# Patient Record
Sex: Male | Born: 1970 | Race: Black or African American | Hispanic: No | Marital: Single | Smoking: Never smoker
Health system: Southern US, Community
[De-identification: ages and names within clinical notes are randomized; demographics above are authoritative.]

## PROBLEM LIST (undated history)

## (undated) DIAGNOSIS — K589 Irritable bowel syndrome without diarrhea: Secondary | ICD-10-CM

## (undated) DIAGNOSIS — K219 Gastro-esophageal reflux disease without esophagitis: Secondary | ICD-10-CM

## (undated) HISTORY — DX: Gastro-esophageal reflux disease without esophagitis: K21.9

## (undated) HISTORY — DX: Irritable bowel syndrome, unspecified: K58.9

---

## 2001-12-15 ENCOUNTER — Ambulatory Visit (HOSPITAL_COMMUNITY): Admission: RE | Admit: 2001-12-15 | Discharge: 2001-12-15 | Payer: Self-pay | Admitting: Internal Medicine

## 2002-01-11 ENCOUNTER — Encounter: Payer: Self-pay | Admitting: Internal Medicine

## 2002-01-11 ENCOUNTER — Ambulatory Visit (HOSPITAL_COMMUNITY): Admission: RE | Admit: 2002-01-11 | Discharge: 2002-01-11 | Payer: Self-pay | Admitting: Internal Medicine

## 2011-02-27 ENCOUNTER — Other Ambulatory Visit: Payer: Self-pay | Admitting: Family Medicine

## 2011-03-04 ENCOUNTER — Ambulatory Visit (HOSPITAL_COMMUNITY)
Admission: RE | Admit: 2011-03-04 | Discharge: 2011-03-04 | Disposition: A | Discharge status: Home / Self Care | Payer: BC Managed Care – PPO | Source: Ambulatory Visit | Attending: Family Medicine | Admitting: Family Medicine

## 2011-03-04 DIAGNOSIS — R109 Unspecified abdominal pain: Secondary | ICD-10-CM | POA: Insufficient documentation

## 2013-07-28 IMAGING — US US ABDOMEN COMPLETE
1 series · 14 of 25 positions shown · non-contrast
Comparison: None available;

CLINICAL DATA: Abdominal pain

ULTRASOUND ABDOMEN:
TECHNIQUE: Sonography of upper abdominal structures was performed.

[Series 1: us abdomen complete · 0.28mm/px · 14 of 66 slices shown]
[im 1/66]
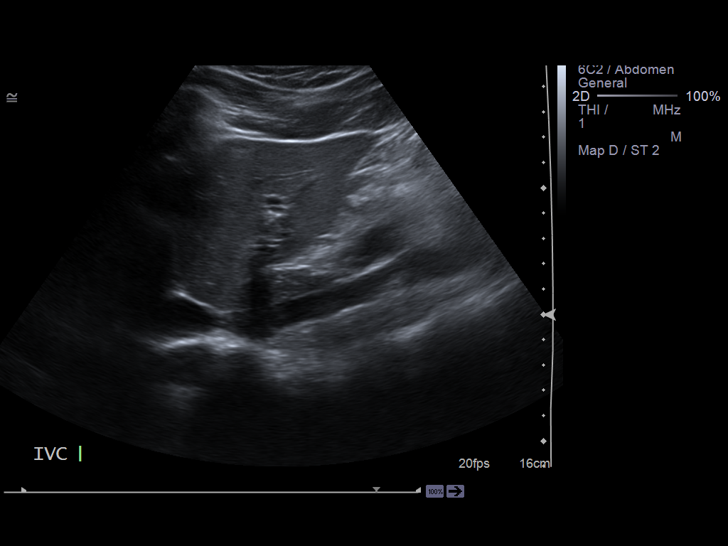
[im 6/66]
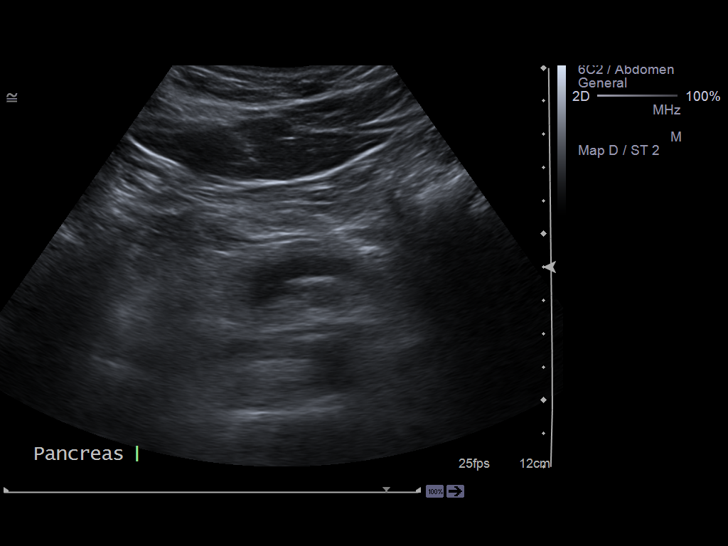
[im 11/66]
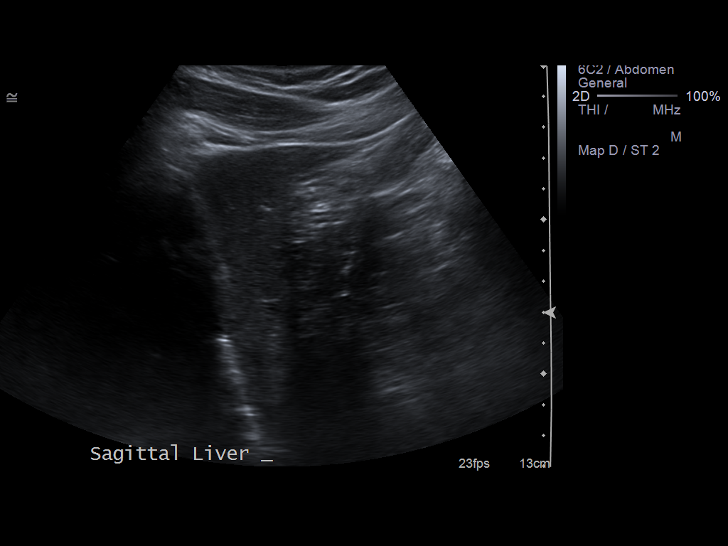
[im 17/66]
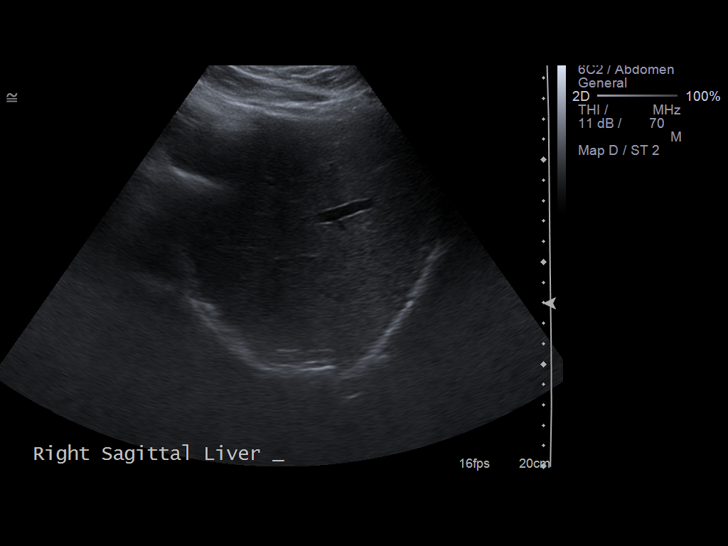
[im 22/66]
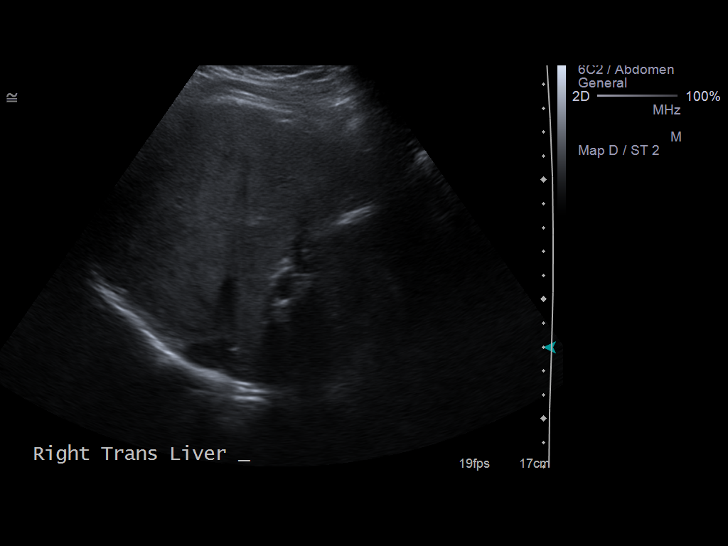
[im 25/66]
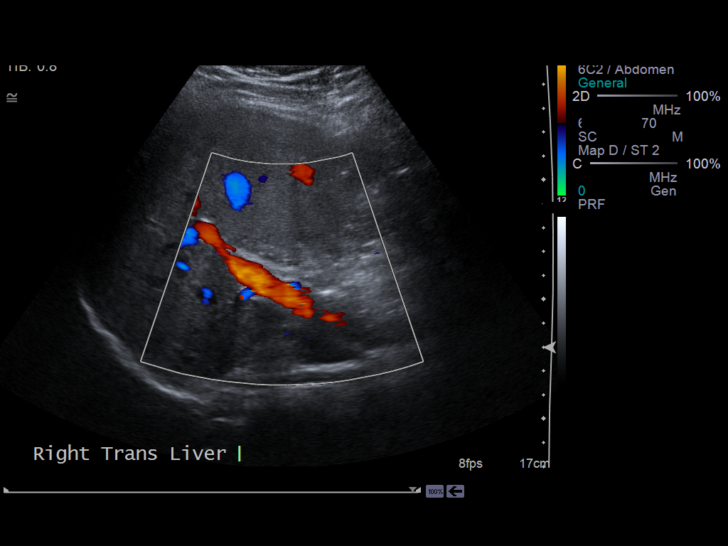
[im 30/66]
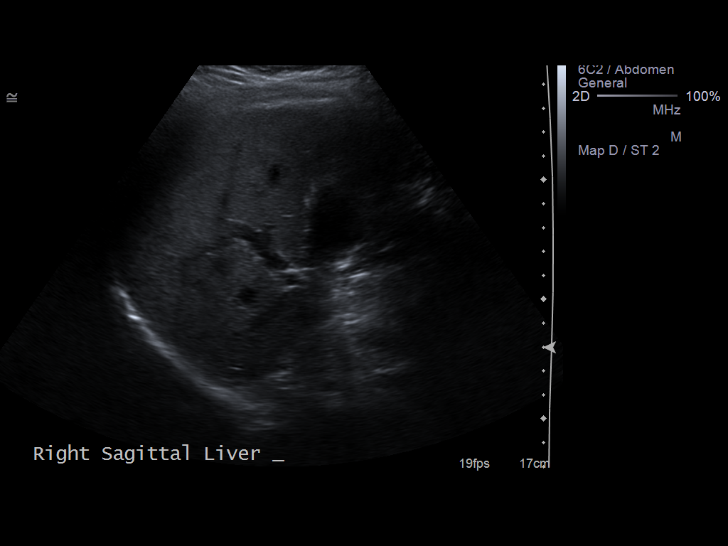
[im 36/66]
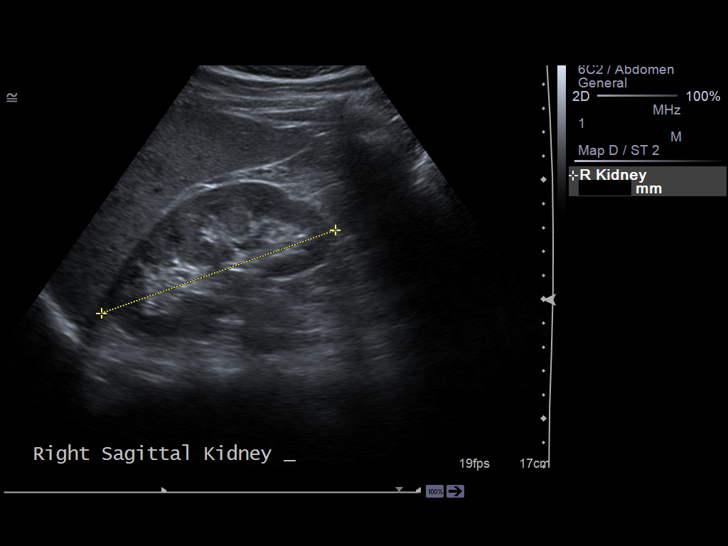
[im 41/66]
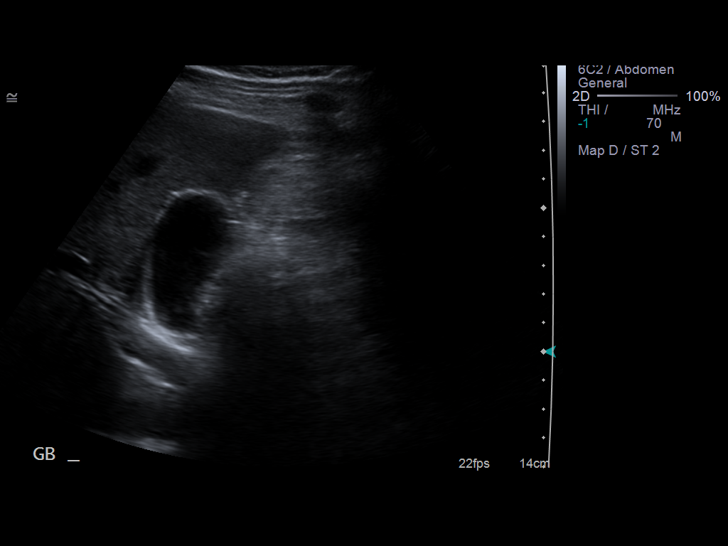
[im 44/66]
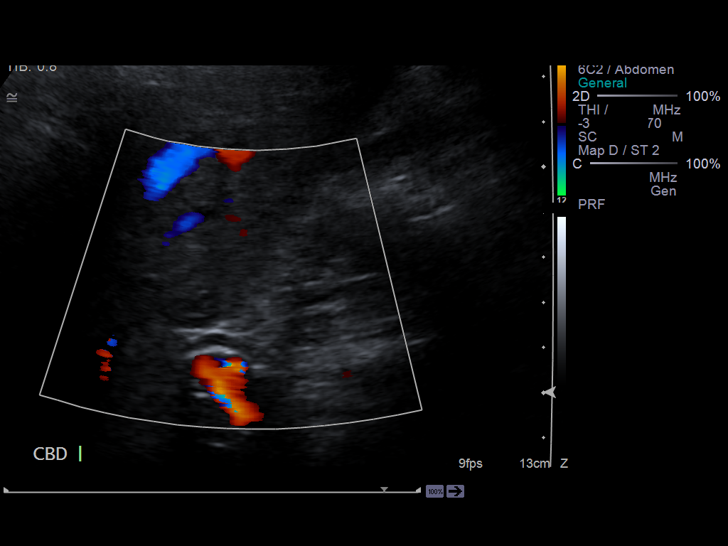
[im 49/66]
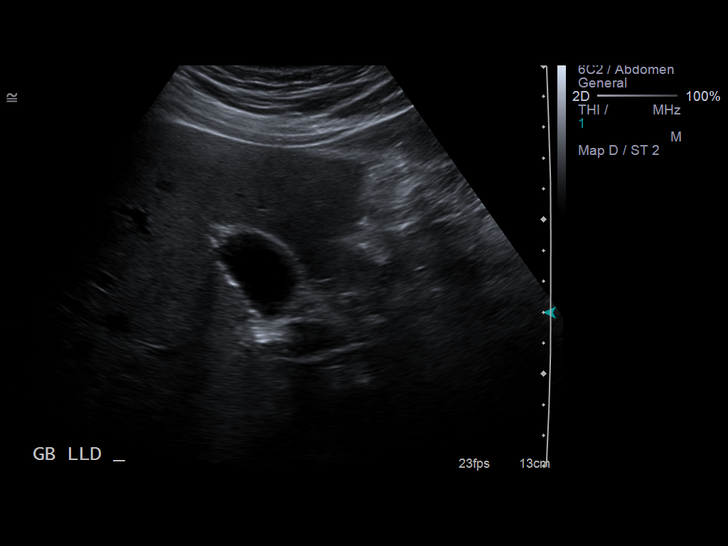
[im 55/66]
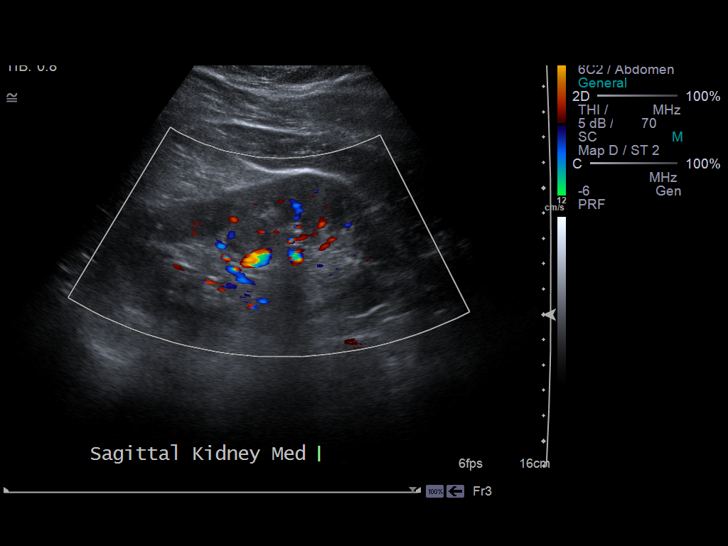
[im 60/66]
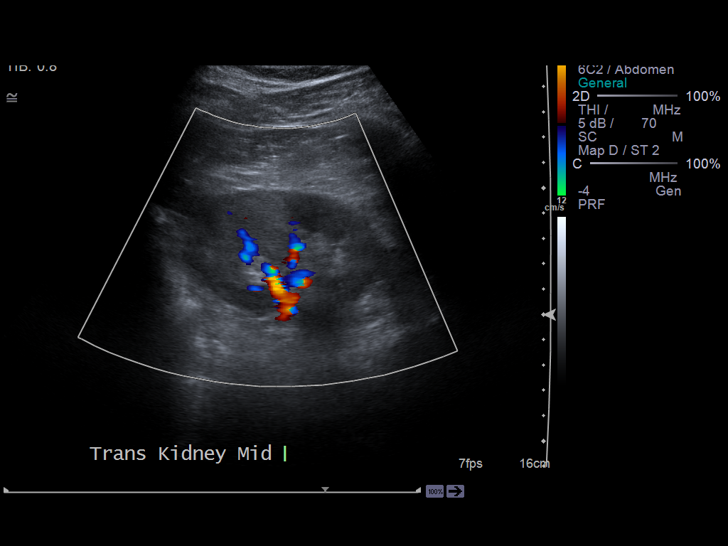
[im 66/66]
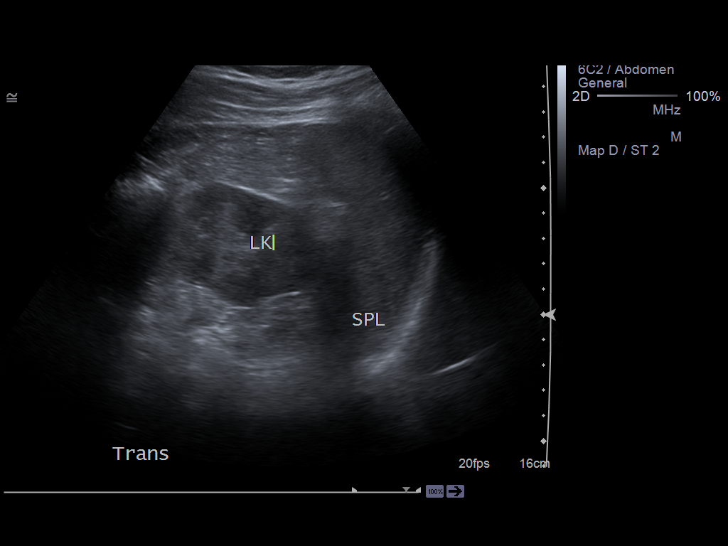

[14 of 25 positions shown; findings below may reference images not displayed]

prior exam from 1770 predates PACs and
is unavailable for comparison

Gallbladder:  Normally distended without stones or wall thickening.
No pericholecystic fluid or sonographic Murphy sign.

Common bile duct:  Normal caliber 3 mm diameter

Liver:  Normal appearance

IVC:  Normal appearance

Pancreas:  Distal tail obscured by bowel gas.  Visualized portions
normal appearance

Spleen:  Normal appearance, 5.2 cm length

Right kidney:  10.4 cm length. Normal morphology without mass or
hydronephrosis.

Left kidney:  11.8 cm length. Normal morphology without mass or
hydronephrosis.

Aorta:  Normal caliber

Other:  No free fluid
IMPRESSION: Incomplete visualization of pancreatic tail.
Otherwise normal exam.

## 2013-11-29 ENCOUNTER — Encounter: Payer: Self-pay | Admitting: Family Medicine

## 2013-11-29 ENCOUNTER — Ambulatory Visit (INDEPENDENT_AMBULATORY_CARE_PROVIDER_SITE_OTHER): Payer: BC Managed Care – PPO | Admitting: Family Medicine

## 2013-11-29 VITALS — BP 124/80 | Temp 98.4°F | Ht 66.0 in | Wt 189.5 lb

## 2013-11-29 DIAGNOSIS — R1010 Upper abdominal pain, unspecified: Secondary | ICD-10-CM

## 2013-11-29 DIAGNOSIS — K589 Irritable bowel syndrome without diarrhea: Secondary | ICD-10-CM | POA: Insufficient documentation

## 2013-11-29 MED ORDER — HYOSCYAMINE SULFATE 0.125 MG SL SUBL: 0.1250 mg | SUBLINGUAL_TABLET | Freq: Four times a day (QID) | SUBLINGUAL | Status: AC | PRN

## 2013-11-29 NOTE — Progress Notes (Signed)
   Subjective:    Patient ID: Ivan Rosales, male    DOB: [birth year], 43 y.o.   MRN: [id number]  Gastrophageal Reflux He complains of abdominal pain and nausea. This is a new problem. The current episode started 1 to 4 weeks ago. The problem has been unchanged. Nothing aggravates the symptoms. There are no known risk factors. He has tried nothing for the symptoms. The treatment provided no relief.  Patient states his IBS has flared up again also.  Patient states he has no other concerns at this time.  His white blood count on recent blood work slightly elevated hemoglobin is good liver functions good kidney functions the glucose slightly elevated  STD labs were checked by the other physicians office and were negative Review of Systems  Gastrointestinal: Positive for nausea and abdominal pain.       Objective:   Physical Exam    Lungs are clear heart regular abdomen soft no guarding or rebound extremities no edema skin warm dry    Assessment & Plan:  Elevated glucose slightly he is to minimize starches in diet exercise on a regular basis  IBS-if he is interested in getting FMLA papers filled out we will be happy to do so he has flareups once or twice per year  Levsin SL as necessary for abdominal cramps and discomfort a progressive trouble with referral to gastroenterology  May use omeprazole when necessary for gastritis when it occurs

## 2013-12-01 ENCOUNTER — Telehealth: Payer: Self-pay | Admitting: *Deleted

## 2013-12-01 NOTE — Telephone Encounter (Signed)
Pt's number [phone]

## 2013-12-01 NOTE — Telephone Encounter (Signed)
Seen Monday by Dr. Nicki Reaper. Prescribed levsin sl for abd cramps. Pt states he has not taken the med because he is not having cramps but his stomach still feels swollen. States he is still feeling dizzy but a little better than on Monday. No abd pain, he is eating and drinking. States his stool are still not solid. No fever.

## 2013-12-06 DIAGNOSIS — Z029 Encounter for administrative examinations, unspecified: Secondary | ICD-10-CM

## 2013-12-15 ENCOUNTER — Ambulatory Visit (INDEPENDENT_AMBULATORY_CARE_PROVIDER_SITE_OTHER): Payer: BC Managed Care – PPO | Admitting: Internal Medicine

## 2014-01-22 ENCOUNTER — Encounter: Payer: Self-pay | Admitting: *Deleted

## 2014-06-15 ENCOUNTER — Ambulatory Visit (INDEPENDENT_AMBULATORY_CARE_PROVIDER_SITE_OTHER): Payer: 59 | Admitting: Family Medicine

## 2014-06-15 ENCOUNTER — Encounter: Payer: Self-pay | Admitting: Family Medicine

## 2014-06-15 VITALS — BP 120/78 | Temp 98.4°F | Ht 66.0 in | Wt 189.5 lb

## 2014-06-15 DIAGNOSIS — D219 Benign neoplasm of connective and other soft tissue, unspecified: Secondary | ICD-10-CM | POA: Diagnosis not present

## 2014-06-15 NOTE — Progress Notes (Signed)
   Subjective:    Patient ID: Ivan Rosales, male    DOB: [birth year], 44 y.o.   MRN: [id number]  HPI Patient is here today because he has a knot on the right side of his chest area. This was first noticed on Saturday. No pain noted. Patient states that he does have abdominal swelling at times also. Patient states that he has no other concerns at this time.  Noticed it this past weekend No pain no fever He just discovered this past weekend  Review of Systems Denies high fever chills sweats nausea vomiting diarrhea abdominal pain chest pain no sudden weight loss appetite has been good    Objective:   Physical Exam  Lungs clear hearts regular abdomen soft right side has a firm area that is freely movable approximately 0.5 cm x 1 7      Assessment & Plan:  This patient has a soft tissue mass in the right side do ultrasound on it to see if the cyst versus solid patient states he is contemplating having it removed from his body via surgery   May need MRI await lab work as well

## 2014-06-16 LAB — CBC WITH DIFFERENTIAL/PLATELET
Basophils Absolute: 0 10*3/uL (ref 0.0–0.2)
Basos: 0 %
EOS (ABSOLUTE): 0.1 10*3/uL (ref 0.0–0.4)
Eos: 1 %
Hematocrit: 47.3 % (ref 37.5–51.0)
Hemoglobin: 16.4 g/dL (ref 12.6–17.7)
Immature Grans (Abs): 0 10*3/uL (ref 0.0–0.1)
Immature Granulocytes: 0 %
Lymphocytes Absolute: 2.1 10*3/uL (ref 0.7–3.1)
Lymphs: 22 %
MCH: 31.5 pg (ref 26.6–33.0)
MCHC: 34.7 g/dL (ref 31.5–35.7)
MCV: 91 fL (ref 79–97)
Monocytes Absolute: 0.7 10*3/uL (ref 0.1–0.9)
Monocytes: 8 %
Neutrophils Absolute: 6.4 10*3/uL (ref 1.4–7.0)
Neutrophils: 69 %
Platelets: 259 10*3/uL (ref 150–379)
RBC: 5.2 x10E6/uL (ref 4.14–5.80)
RDW: 12.3 % (ref 12.3–15.4)
WBC: 9.4 10*3/uL (ref 3.4–10.8)

## 2014-06-16 LAB — HEPATIC FUNCTION PANEL
ALT: 29 IU/L (ref 0–44)
AST: 16 IU/L (ref 0–40)
Albumin: 4.7 g/dL (ref 3.5–5.5)
Alkaline Phosphatase: 81 IU/L (ref 39–117)
Bilirubin Total: 0.5 mg/dL (ref 0.0–1.2)
Bilirubin, Direct: 0.14 mg/dL (ref 0.00–0.40)
Total Protein: 6.8 g/dL (ref 6.0–8.5)

## 2014-06-20 ENCOUNTER — Ambulatory Visit (HOSPITAL_COMMUNITY)
Admission: RE | Admit: 2014-06-20 | Discharge: 2014-06-20 | Disposition: A | Discharge status: Home / Self Care | Payer: 59 | Source: Ambulatory Visit | Attending: Family Medicine | Admitting: Family Medicine

## 2014-06-20 DIAGNOSIS — R222 Localized swelling, mass and lump, trunk: Secondary | ICD-10-CM | POA: Diagnosis not present

## 2014-06-21 ENCOUNTER — Other Ambulatory Visit: Payer: Self-pay

## 2014-06-21 DIAGNOSIS — D179 Benign lipomatous neoplasm, unspecified: Secondary | ICD-10-CM

## 2014-07-01 ENCOUNTER — Encounter: Payer: Self-pay | Admitting: Family Medicine

## 2015-03-31 ENCOUNTER — Encounter: Payer: Self-pay | Admitting: Family Medicine

## 2015-03-31 ENCOUNTER — Ambulatory Visit (INDEPENDENT_AMBULATORY_CARE_PROVIDER_SITE_OTHER): Payer: 59 | Admitting: Family Medicine

## 2015-03-31 VITALS — BP 102/78 | Temp 98.7°F | Ht 66.0 in | Wt 186.5 lb

## 2015-03-31 DIAGNOSIS — D171 Benign lipomatous neoplasm of skin and subcutaneous tissue of trunk: Secondary | ICD-10-CM | POA: Diagnosis not present

## 2015-03-31 DIAGNOSIS — R21 Rash and other nonspecific skin eruption: Secondary | ICD-10-CM | POA: Diagnosis not present

## 2015-03-31 DIAGNOSIS — M545 Low back pain, unspecified: Secondary | ICD-10-CM

## 2015-03-31 DIAGNOSIS — N489 Disorder of penis, unspecified: Secondary | ICD-10-CM | POA: Diagnosis not present

## 2015-03-31 LAB — POCT URINALYSIS DIPSTICK
Spec Grav, UA: 1.01
pH, UA: 6

## 2015-03-31 MED ORDER — KETOCONAZOLE 2 % EX CREA: 1.0000 "application " | TOPICAL_CREAM | Freq: Two times a day (BID) | CUTANEOUS | Status: DC | PRN

## 2015-03-31 NOTE — Patient Instructions (Addendum)
Within the next 3 to 6 months schedule a wellness exam and do lab testing ( ask for the orders for the labs when you schedule the wellness exam)   If you decide to do testing for fatigue let us know and we can order it  If you have the penile rash occur and you want me to take a quick look at it please call

## 2015-03-31 NOTE — Progress Notes (Signed)
   Subjective:    Patient ID: Ivan Rosales, male    DOB: Dec 05, 1970, 45 y.o.   MRN: [id number]  Rash This is a new problem. The current episode started 1 to 4 weeks ago. The problem is unchanged. The affected locations include the genitalia. The rash is characterized by redness. He was exposed to nothing. Associated symptoms include rhinorrhea. Past treatments include nothing. The treatment provided no relief.   Patient states that he has right sided back pain also.  States intermittent low back pain on the right side over the past couple months sometimes with certain motions   Review of Systems  HENT: Positive for rhinorrhea.   Skin: Positive for rash.   Patient states the penile rash is causing mobility issues burning discomfort when it occurs it reoccurring. He does use a lubricant that has added this when he has relations with his wife    Objective:   Physical Exam Low back subjective tenderness on the right side there is a lipoma about 8 inches above that that I do not feel is causing any problems it is soft movable approximately 34 cm in diameter negative straight leg raise. Abdomen soft lungs clear heart regular       Assessment & Plan:   penile rash-probably yeast could be related to the lubricant he is using with intercourse I recommend try to avoid lubricants other than something simple like KY jelly. If he has a penile rash again in the future he can come back here and we will take a look at it while he hasn't as a courtesy    back pain I find no evidence of a kidney infection or hematuria I think it's more musculoskeletal exercises recommended   lipoma on the right side at don't feel this has anything to do with his back pain    he mentioned at the end of the visit fatigue and tiredness he might decide to do up visit in the future to look into this Wellness recommended as well

## 2015-04-02 DIAGNOSIS — R21 Rash and other nonspecific skin eruption: Secondary | ICD-10-CM | POA: Insufficient documentation

## 2015-04-02 DIAGNOSIS — M545 Low back pain, unspecified: Secondary | ICD-10-CM | POA: Insufficient documentation

## 2015-04-06 ENCOUNTER — Telehealth: Payer: Self-pay | Admitting: Family Medicine

## 2015-04-06 MED ORDER — KETOCONAZOLE 2 % EX CREA: 1.0000 "application " | TOPICAL_CREAM | Freq: Two times a day (BID) | CUTANEOUS | Status: AC | PRN

## 2015-04-06 NOTE — Telephone Encounter (Signed)
Certainly he can try on the other areas please send then larger amount, may have 4 ounces at a time with 5 refills if ongoing troubles with rashes may need dermatology consult

## 2015-04-06 NOTE — Telephone Encounter (Signed)
Pt called stating that he has a rash on his legs and buttocks. Pt states that the cream he was given for the other rash he was seen for on Friday is working for the other rash. Pt wants to know if he should use the cream on this rash and if so he will need more called in.    [facility]

## 2015-04-06 NOTE — Telephone Encounter (Signed)
Left message to return call (med sent in)

## 2015-04-06 NOTE — Telephone Encounter (Addendum)
Discussed with patient. Patient advised that  Certainly he can try on the other areas, if ongoing troubles with rashes may need dermatology consult. Rx sent electronically to pharmacy. Patient verbalized understanding.

## 2015-04-14 ENCOUNTER — Ambulatory Visit (INDEPENDENT_AMBULATORY_CARE_PROVIDER_SITE_OTHER): Payer: 59 | Admitting: Family Medicine

## 2015-04-14 ENCOUNTER — Encounter: Payer: Self-pay | Admitting: Family Medicine

## 2015-04-14 VITALS — BP 128/82 | Ht 66.0 in | Wt 185.4 lb

## 2015-04-14 DIAGNOSIS — R5383 Other fatigue: Secondary | ICD-10-CM

## 2015-04-14 NOTE — Progress Notes (Signed)
   Subjective:    Patient ID: Ivan Rosales, male    DOB: April 29, 1970, 45 y.o.   MRN: [id number]  HPI  Patient arrives to discuss fatigue.  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 Low energy. States his moods are doing good denies being depressed. In addition to this he is working full time commuting over 2 hours per day and also going to school several days per week this is stretching him. PMH benign Review of Systems    no fever chills sweats nausea vomiting diarrhea rash cough. Objective:   Physical Exam Neck no masses lungs clear no crackles heart is regular abdomen is soft no guarding or rebound extremities no edema skin warm dry       Assessment & Plan:  If ongoing issues in regards to rash around the privates region I would recommend this patient consider seeing dermatology  Fatigue and tiredness-it is hard to discern the source of this this patient is working a couple different things including work and school and not getting proper rest all of this is causing I think significant fatigue we will run some lab work to rule out other potential causes

## 2015-04-15 LAB — CBC WITH DIFFERENTIAL/PLATELET
BASOS ABS: 0 10*3/uL (ref 0.0–0.2)
Basos: 0 %
EOS (ABSOLUTE): 0.3 10*3/uL (ref 0.0–0.4)
Eos: 3 %
HEMATOCRIT: 46.9 % (ref 37.5–51.0)
Hemoglobin: 16.2 g/dL (ref 12.6–17.7)
Immature Grans (Abs): 0 10*3/uL (ref 0.0–0.1)
Immature Granulocytes: 0 %
LYMPHS: 28 %
Lymphocytes Absolute: 2.6 10*3/uL (ref 0.7–3.1)
MCH: 31.6 pg (ref 26.6–33.0)
MCHC: 34.5 g/dL (ref 31.5–35.7)
MCV: 92 fL (ref 79–97)
MONOCYTES: 8 %
Monocytes Absolute: 0.8 10*3/uL (ref 0.1–0.9)
NEUTROS PCT: 61 %
Neutrophils Absolute: 5.5 10*3/uL (ref 1.4–7.0)
Platelets: 263 10*3/uL (ref 150–379)
RBC: 5.12 x10E6/uL (ref 4.14–5.80)
RDW: 12.9 % (ref 12.3–15.4)
WBC: 9.1 10*3/uL (ref 3.4–10.8)

## 2015-04-15 LAB — HEPATIC FUNCTION PANEL
ALK PHOS: 90 IU/L (ref 39–117)
ALT: 23 IU/L (ref 0–44)
AST: 16 IU/L (ref 0–40)
Albumin: 4.5 g/dL (ref 3.5–5.5)
BILIRUBIN TOTAL: 0.3 mg/dL (ref 0.0–1.2)
BILIRUBIN, DIRECT: 0.11 mg/dL (ref 0.00–0.40)
Total Protein: 7 g/dL (ref 6.0–8.5)

## 2015-04-15 LAB — BASIC METABOLIC PANEL
BUN / CREAT RATIO: 7 — AB (ref 9–20)
BUN: 8 mg/dL (ref 6–24)
CO2: 27 mmol/L (ref 18–29)
CREATININE: 1.11 mg/dL (ref 0.76–1.27)
Calcium: 9.6 mg/dL (ref 8.7–10.2)
Chloride: 99 mmol/L (ref 96–106)
GFR calc Af Amer: 92 mL/min/{1.73_m2} (ref >59)
GFR, EST NON AFRICAN AMERICAN: 80 mL/min/{1.73_m2} (ref >59)
Glucose: 94 mg/dL (ref 65–99)
Potassium: 4.2 mmol/L (ref 3.5–5.2)
SODIUM: 141 mmol/L (ref 134–144)

## 2015-04-15 LAB — TESTOSTERONE: Testosterone: 250 ng/dL — ABNORMAL LOW (ref 348–1197)

## 2015-04-15 LAB — TSH: TSH: 0.382 u[IU]/mL — ABNORMAL LOW (ref 0.450–4.500)

## 2015-04-15 LAB — FERRITIN: FERRITIN: 237 ng/mL (ref 30–400)

## 2015-04-15 LAB — HEPATITIS C ANTIBODY: Hep C Virus Ab: <0.1 s/co ratio (ref 0.0–0.9)

## 2015-04-17 ENCOUNTER — Other Ambulatory Visit: Payer: Self-pay

## 2015-04-17 DIAGNOSIS — R899 Unspecified abnormal finding in specimens from other organs, systems and tissues: Secondary | ICD-10-CM

## 2015-04-17 DIAGNOSIS — R5383 Other fatigue: Secondary | ICD-10-CM

## 2015-08-12 LAB — TSH: TSH: 0.713 u[IU]/mL (ref 0.450–4.500)

## 2015-08-12 LAB — T4, FREE: Free T4: 1.07 ng/dL (ref 0.82–1.77)

## 2015-08-12 LAB — TESTOSTERONE: Testosterone: 240 ng/dL — ABNORMAL LOW (ref 348–1197)

## 2016-11-13 IMAGING — US US CHEST/MEDIASTINUM
1 series · 10 of 10 positions shown · non-contrast
Comparison: None.

CLINICAL DATA: New palpable structure along the right side of the
chest.

EXAM:
ULTRASOUND OF CHEST SOFT TISSUES
TECHNIQUE: Ultrasound examination of the chest wall soft tissues was performed
in the area of clinical concern.

[Series 1: us chest/mediastinum · 0.05mm/px · 10 acquisitions, 10 frames shown]
[im 1/10]
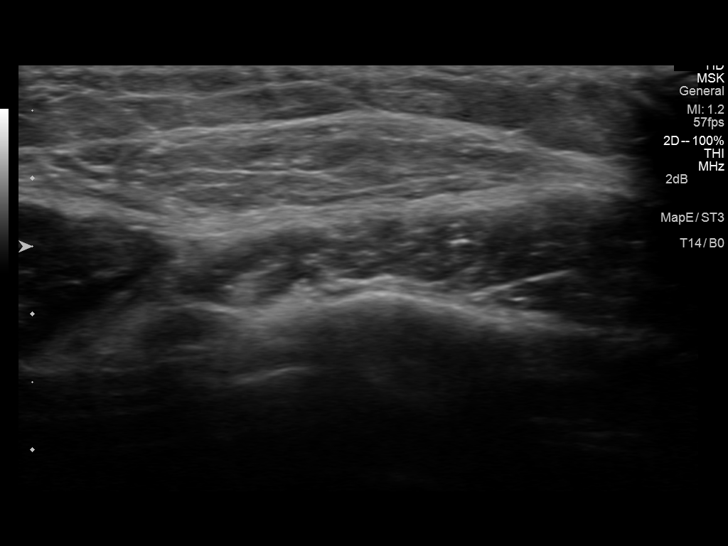
[im 2/10]
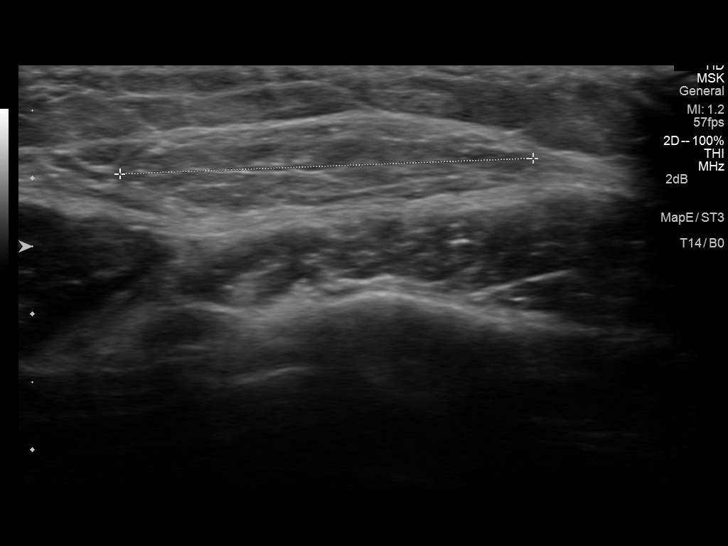
[im 3/10]
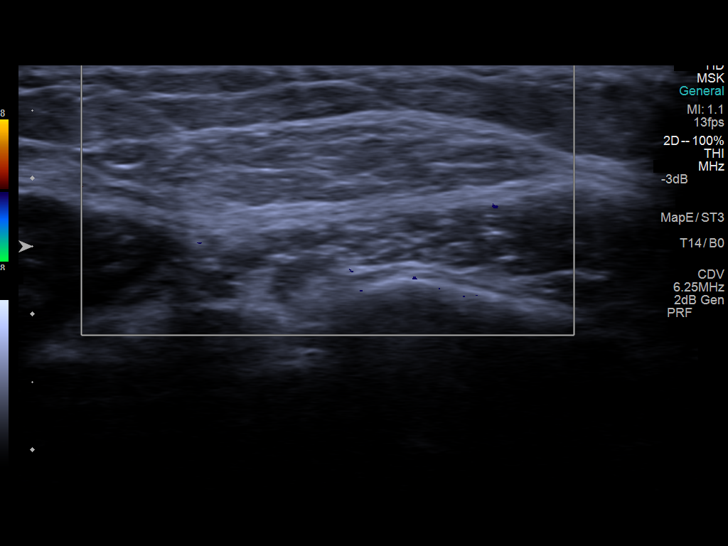
[im 4/10]
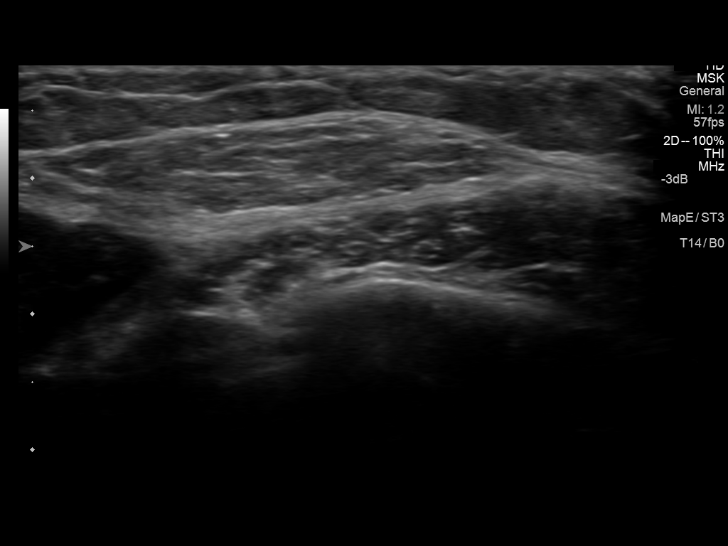
[im 5/10]
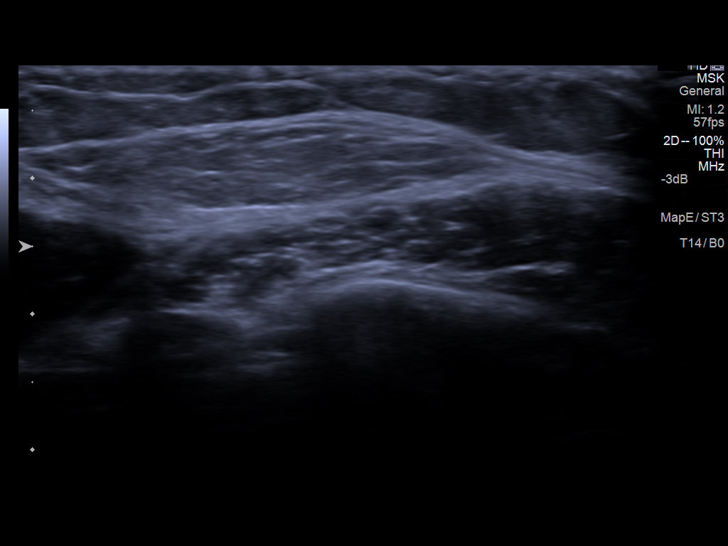
[im 6/10]
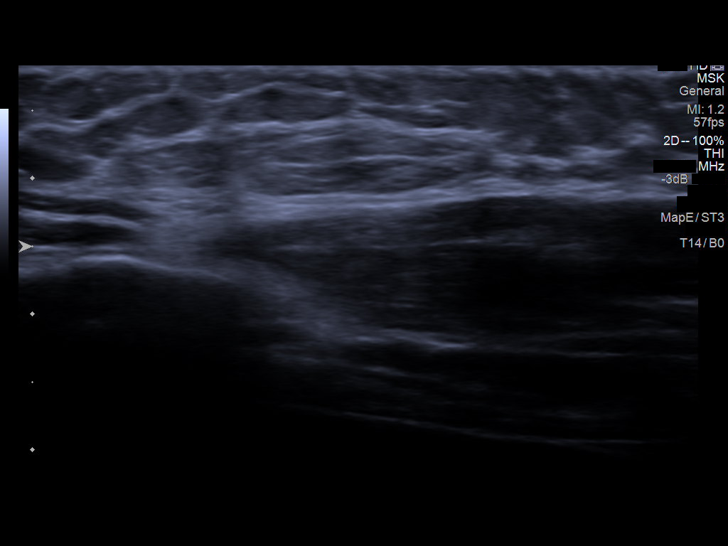
[im 7/10]
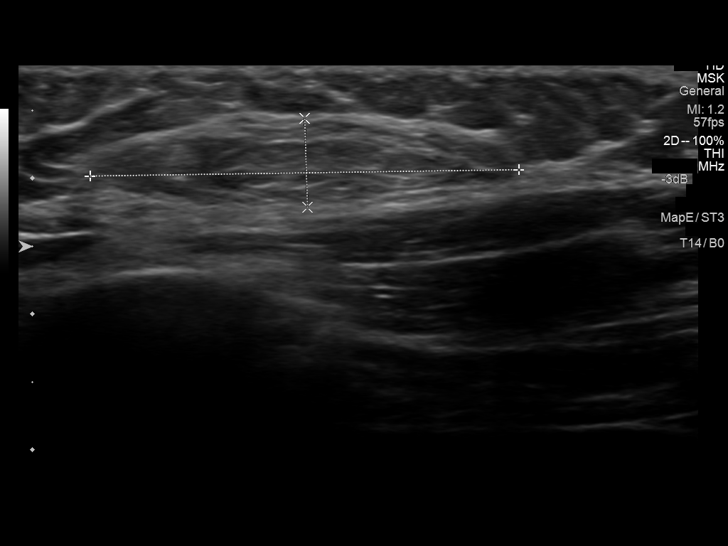
[im 8/10]
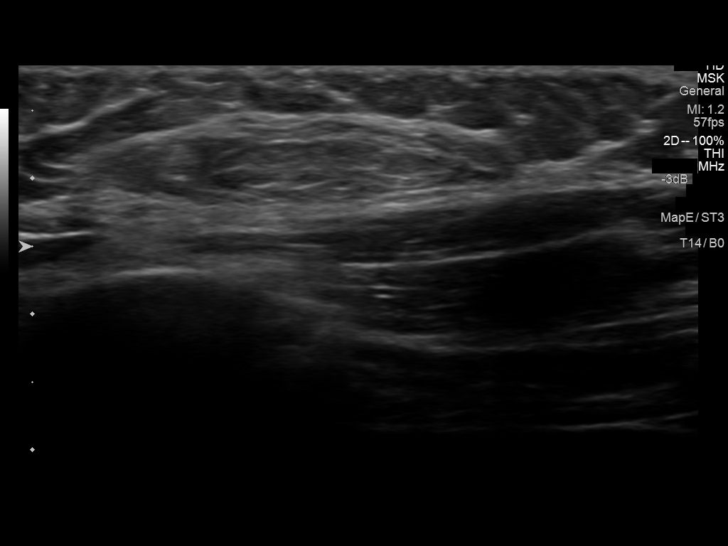
[im 9/10]
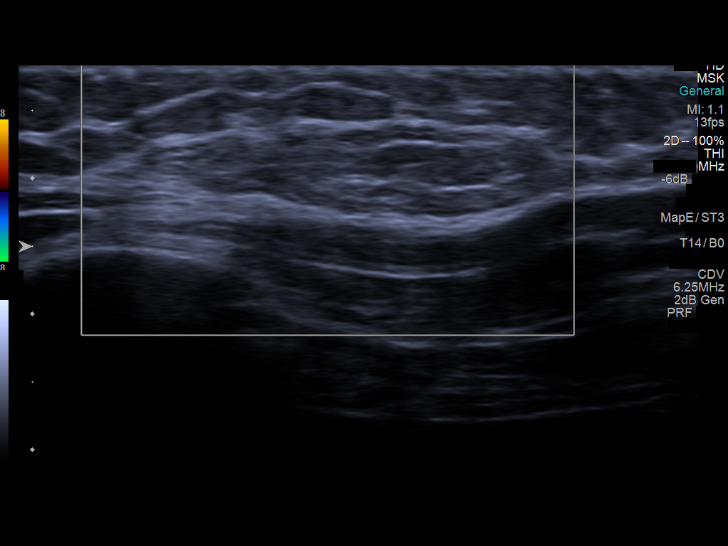
[im 10/10]
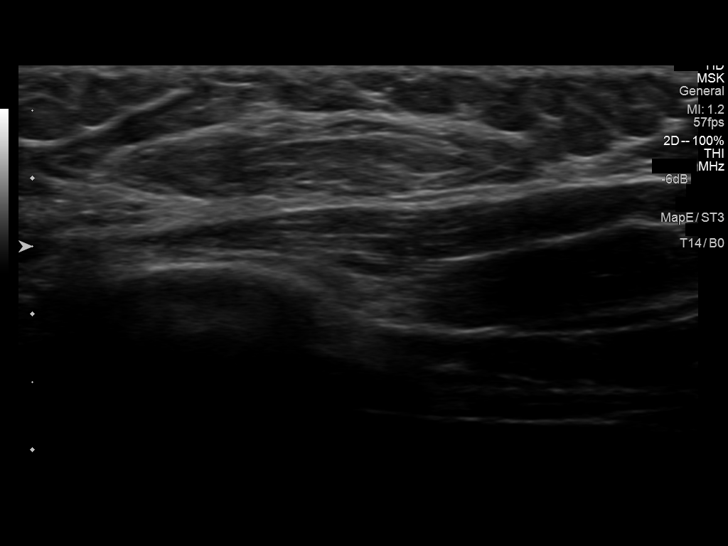

[10 of 10 positions shown; findings below may reference images not displayed]

FINDINGS: The palpable area along the right side of the chest was evaluated.
Slightly prominent subcutaneous tissue appears to correspond with
the palpable area. This area roughly measures 3.2 x 0.7 x 3.1 cm.
This soft tissue is essentially avascular and superficial to the
muscular structures. The contralateral side was not evaluated.
IMPRESSION: Slightly prominent subcutaneous tissue along the right side of the
chest that corresponds with the palpable abnormality. These findings
are nonspecific. A lipoma is in the differential diagnosis.

## 2022-05-20 ENCOUNTER — Ambulatory Visit
Admission: RE | Admit: 2022-05-20 | Discharge: 2022-05-20 | Disposition: A | Discharge status: Home / Self Care | Payer: BC Managed Care – PPO | Source: Ambulatory Visit | Attending: Family Medicine | Admitting: Family Medicine

## 2022-05-20 VITALS — BP 124/80 | HR 73 | Temp 98.3°F | Resp 18

## 2022-05-20 DIAGNOSIS — G5601 Carpal tunnel syndrome, right upper limb: Secondary | ICD-10-CM | POA: Diagnosis not present

## 2022-05-20 NOTE — ED Provider Notes (Addendum)
[facility]    CSN: [id number] Arrival date & time: 05/20/22  1547      History   Chief Complaint Chief Complaint  Patient presents with   Wrist Pain    Entered by patient   Numbness    HPI Ivan Rosales is a 52 y.o. male.   Patient presents with concerns of right wrist discomfort. He states it is intermittent, mainly when doing something repetitive or more strenuous. 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 He states it bothers him the most at night and when he first wakes up. Sometimes he has numbness into his middle and ring finger. He denies known specific injury. He has not taken or tried anything for it. He is left-handed.   The history is provided by the patient.  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 No pertinent surgical history.     Home Medications    Prior to Admission medications   Medication Sig Start Date End Date Taking? Authorizing Provider  hyoscyamine (LEVSIN/SL) 0.125 MG SL tablet Place 1 tablet (0.125 mg total) under the tongue every 6 (six) hours as needed for cramping. 11/29/13   Kathyrn Drown, MD  ketoconazole (NIZORAL) 2 % cream Apply 1 application topically 2 (two) times daily as needed for irritation. 04/06/15   Kathyrn Drown, MD    Family History Family History  Problem Relation Age of Onset   Cancer Mother        breast   Diabetes Mother    Hypertension Father     Social History Social History   Tobacco Use   Smoking status: Never     Allergies   Other and Nexium [esomeprazole magnesium]   Review of Systems Review of Systems   Musculoskeletal:  Positive for arthralgias. Negative for joint swelling.  Skin:  Negative for color change and rash.  Neurological:  Positive for numbness. Negative for weakness.     Physical Exam Triage Vital Signs ED Triage Vitals  Enc Vitals Group     BP 05/20/22 1605 124/80     Pulse Rate 05/20/22 1605 73     Resp 05/20/22 1605 18     Temp 05/20/22 1605 98.3 F (36.8 C)     Temp Source 05/20/22 1605 Oral     SpO2 05/20/22 1605 96 %     Weight --      Height --      Head Circumference --      Peak Flow --      Pain Score 05/20/22 1602 5     Pain Loc --      Pain Edu? --      Excl. in [place]? --    No data found.  Updated Vital Signs BP 124/80 (BP Location: Left Arm)   Pulse 73   Temp 98.3 F (36.8 C) (Oral)   Resp 18   SpO2 96%   Visual Acuity Right Eye Distance:   Left Eye Distance:   Bilateral Distance:    Right Eye Near:   Left Eye Near:  Bilateral Near:     Physical Exam Vitals and nursing note reviewed.  Constitutional:      General: He is not in acute distress. HENT:     Head: Normocephalic.  Cardiovascular:     Pulses: Normal pulses.  Pulmonary:     Effort: Pulmonary effort is normal.  Musculoskeletal:     Right wrist: No tenderness. Normal range of motion.     Comments: Points to volar R wrist as location of pain. Denies tenderness. Positive Phalen test.   Skin:    Findings: No rash.  Neurological:     Mental Status: He is alert.  Psychiatric:        Mood and Affect: Mood normal.      UC Treatments / Results  Labs (all labs ordered are listed, but only abnormal results are displayed) Labs Reviewed - No data to display  EKG   Radiology No results found.  Procedures Procedures (including critical care time)  Medications Ordered in UC Medications - No data to display  Initial Impression / Assessment and Plan / UC Course  I have reviewed the triage vital signs and the nursing notes.  Pertinent labs & imaging results that  were available during my care of the patient were reviewed by me and considered in my medical decision making (see chart for details).     Splinting and conservative treatment for sx consistent with carpal tunnel. Splint ordered but patient declined and states he will use one from home. No specific injury or bony tenderness to indicate imaging at this time. Discussed ortho follow-up if minimal improvement.   E/M: 1 acute uncomplicated illness, no data, low risk  Final Clinical Impressions(s) / UC Diagnoses   Final diagnoses:  Carpal tunnel syndrome of right wrist     Discharge Instructions      Wear brace - recommend wearing it at night while you sleep and when able during the day. Take ibuprofen if needed for discomfort. If no improvement in a couple weeks with brace, follow-up with ortho.    ED Prescriptions   None    PDMP not reviewed this encounter.   Delsa Sale, PA 05/20/22 1621    Delsa Sale, [place] 05/20/22 [phone]

## 2022-05-20 NOTE — ED Triage Notes (Addendum)
Pt c/o right sided wrist discomfort/tightness that occurred post workout. The patient states sometimes he has numbness to the right side middle and ring finger.  Started: about 3 weeks ago  Home interventions: none

## 2022-05-20 NOTE — Discharge Instructions (Addendum)
Wear brace - recommend wearing it at night while you sleep and when able during the day. Take ibuprofen if needed for discomfort. If no improvement in a couple weeks with brace, follow-up with ortho.
